# Patient Record
Sex: Female | Born: 1960 | Hispanic: Yes | Marital: Married | Smoking: Never smoker
Health system: Southern US, Community
[De-identification: ages and names within clinical notes are randomized; demographics above are authoritative.]

## PROBLEM LIST (undated history)

## (undated) DIAGNOSIS — E0590 Thyrotoxicosis, unspecified without thyrotoxic crisis or storm: Secondary | ICD-10-CM

## (undated) DIAGNOSIS — E785 Hyperlipidemia, unspecified: Secondary | ICD-10-CM

## (undated) DIAGNOSIS — D689 Coagulation defect, unspecified: Secondary | ICD-10-CM

## (undated) DIAGNOSIS — I2089 Other forms of angina pectoris: Secondary | ICD-10-CM

## (undated) DIAGNOSIS — J449 Chronic obstructive pulmonary disease, unspecified: Secondary | ICD-10-CM

## (undated) DIAGNOSIS — K254 Chronic or unspecified gastric ulcer with hemorrhage: Secondary | ICD-10-CM

## (undated) DIAGNOSIS — I208 Other forms of angina pectoris: Secondary | ICD-10-CM

## (undated) DIAGNOSIS — I4891 Unspecified atrial fibrillation: Secondary | ICD-10-CM

## (undated) DIAGNOSIS — D696 Thrombocytopenia, unspecified: Secondary | ICD-10-CM

## (undated) DIAGNOSIS — J45909 Unspecified asthma, uncomplicated: Secondary | ICD-10-CM

## (undated) DIAGNOSIS — D649 Anemia, unspecified: Secondary | ICD-10-CM

## (undated) DIAGNOSIS — K219 Gastro-esophageal reflux disease without esophagitis: Secondary | ICD-10-CM

## (undated) DIAGNOSIS — T883XXA Malignant hyperthermia due to anesthesia, initial encounter: Secondary | ICD-10-CM

## (undated) DIAGNOSIS — I509 Heart failure, unspecified: Secondary | ICD-10-CM

## (undated) DIAGNOSIS — H539 Unspecified visual disturbance: Secondary | ICD-10-CM

## (undated) DIAGNOSIS — I519 Heart disease, unspecified: Secondary | ICD-10-CM

## (undated) DIAGNOSIS — I219 Acute myocardial infarction, unspecified: Secondary | ICD-10-CM

## (undated) HISTORY — DX: Unspecified asthma, uncomplicated: J45.909

## (undated) HISTORY — DX: Unspecified visual disturbance: H53.9

## (undated) HISTORY — DX: Chronic or unspecified gastric ulcer with hemorrhage: K25.4

## (undated) HISTORY — DX: Gastro-esophageal reflux disease without esophagitis: K21.9

## (undated) HISTORY — DX: Other forms of angina pectoris: I20.8

## (undated) HISTORY — DX: Acute myocardial infarction, unspecified: I21.9

## (undated) HISTORY — DX: Heart disease, unspecified: I51.9

## (undated) HISTORY — DX: Thrombocytopenia, unspecified: D69.6

## (undated) HISTORY — DX: Coagulation defect, unspecified: D68.9

## (undated) HISTORY — DX: Chronic obstructive pulmonary disease, unspecified: J44.9

## (undated) HISTORY — DX: Heart failure, unspecified: I50.9

## (undated) HISTORY — DX: Malignant hyperthermia due to anesthesia, initial encounter: T88.3XXA

## (undated) HISTORY — DX: Anemia, unspecified: D64.9

## (undated) HISTORY — DX: Thyrotoxicosis, unspecified without thyrotoxic crisis or storm: E05.90

## (undated) HISTORY — DX: Unspecified atrial fibrillation: I48.91

## (undated) HISTORY — PX: OTHER SURGICAL HISTORY: SHX169

## (undated) HISTORY — DX: Hyperlipidemia, unspecified: E78.5

## (undated) HISTORY — DX: Other forms of angina pectoris: I20.89

---

## 1998-02-10 DIAGNOSIS — I219 Acute myocardial infarction, unspecified: Secondary | ICD-10-CM

## 1998-02-10 HISTORY — DX: Acute myocardial infarction, unspecified: I21.9

## 1998-02-10 HISTORY — PX: CORONARY ANGIOPLASTY WITH STENT PLACEMENT: SHX49

## 2013-03-23 ENCOUNTER — Ambulatory Visit: Payer: Self-pay | Admitting: Internal Medicine

## 2014-04-07 ENCOUNTER — Encounter: Payer: Self-pay | Admitting: General Surgery

## 2014-04-18 ENCOUNTER — Ambulatory Visit (INDEPENDENT_AMBULATORY_CARE_PROVIDER_SITE_OTHER): Payer: Medicaid Other | Admitting: General Surgery

## 2014-04-18 ENCOUNTER — Ambulatory Visit: Payer: Self-pay | Admitting: Internal Medicine

## 2014-04-18 ENCOUNTER — Encounter: Payer: Self-pay | Admitting: General Surgery

## 2014-04-18 VITALS — BP 140/70 | HR 84 | Resp 14 | Ht 65.5 in | Wt 174.0 lb

## 2014-04-18 DIAGNOSIS — Z1211 Encounter for screening for malignant neoplasm of colon: Secondary | ICD-10-CM | POA: Diagnosis not present

## 2014-04-18 MED ORDER — POLYETHYLENE GLYCOL 3350 17 GM/SCOOP PO POWD: 1.0000 | Freq: Once | ORAL | Status: AC

## 2014-04-18 NOTE — Progress Notes (Signed)
Patient ID: Desiree NorrisRosialie Ellis, female   DOB: 06/25/1960, 54 y.o.   MRN: [id number]  Chief Complaint  Patient presents with  . Other    screening colonoscopy    HPI Desiree NorrisRosialie Ellis is a 54 y.o. female here today for a evalution of a screening colonoscopy. No Gi problems at this time. No prior colonoscopies. No known family history of colon problems.   HPI  Past Medical History  Diagnosis Date  . Heart attack   . Hyperlipidemia   . GERD (gastroesophageal reflux disease)   . Asthma   . Bleeding stomach ulcer     Past Surgical History  Procedure Laterality Date  . Extraction of teeth    . Coronary angioplasty with stent placement  2000    Family History  Problem Relation Age of Onset  . Cancer Maternal Grandmother     unsure if uterian or cerivical    Social History History  Substance Use Topics  . Smoking status: Never Smoker   . Smokeless tobacco: Never Used  . Alcohol Use: Yes    Allergies  Allergen Reactions  . Aspirin Swelling  . Latex Rash  . Strawberry Rash    Current Outpatient Prescriptions  Medication Sig Dispense Refill  . digoxin (LANOXIN) 0.125 MG tablet Take 0.125 mg by mouth daily.    . furosemide (LASIX) 40 MG tablet Take 40 mg by mouth daily.    Marland Kitchen. ibuprofen (ADVIL,MOTRIN) 200 MG tablet Take 200 mg by mouth every 6 (six) hours as needed.    . Ipratropium-Albuterol (COMBIVENT) 20-100 MCG/ACT AERS respimat Inhale 1 puff into the lungs every 6 (six) hours.    . metoprolol succinate (TOPROL-XL) 50 MG 24 hr tablet Take 50 mg by mouth daily. Take with or immediately following a meal.    . nitroGLYCERIN (NITROSTAT) 0.4 MG SL tablet Place 0.4 mg under the tongue every 5 (five) minutes as needed for chest pain.    . potassium chloride SA (K-DUR,KLOR-CON) 20 MEQ tablet Take 20 mEq by mouth daily.    . ranitidine (ZANTAC) 150 MG capsule Take 150 mg by mouth 2 (two) times daily.    . vitamin C (ASCORBIC ACID) 500 MG tablet Take 500 mg by mouth daily.    .  polyethylene glycol powder (GLYCOLAX/MIRALAX) powder Take 255 g by mouth once. 255 g 0   No current facility-administered medications for this visit.    Review of Systems Review of Systems  Constitutional: Negative.   Respiratory: Negative.   Cardiovascular: Negative.   Gastrointestinal: Negative.     Blood pressure 140/70, pulse 84, resp. rate 14, height 5' 5.5" (1.664 m), weight 174 lb (78.926 kg).  Physical Exam Physical Exam  Constitutional: She is oriented to person, place, and time. She appears well-developed and well-nourished.  Cardiovascular: Normal rate, regular rhythm and normal heart sounds.   No murmur heard. Pulmonary/Chest: Effort normal and breath sounds normal.  Neurological: She is alert and oriented to person, place, and time.  Skin: Skin is warm and dry.    Data Reviewed Laboratory studies dated 03/09/2013 were reviewed. Creatinine 0.8, estimated GFR 85. Normal electrolytes. Her function studies. Serum digoxin 0.9.  Assessment    Candidate for screening colonoscopy.    Plan    The pros and cons of screening colonoscopy were reviewed in careful detail. The risks associated with the procedure including those of bleeding and perforation were discussed.   The patient denies any recent use of nitroglycerin for chest pain.  Patient is scheduled for  a Colonoscopy at [facility] on 05/23/14. She is aware to pre register with the hospital at least 2 days prior. She will only take her Digoxin and Toprol at 6 am with a small sip of water the morning of. Miralax prescription has been sent into her pharmacy. Patient is aware of date and all instructions.         Earline Mayotte 04/20/2014, 6:26 PM

## 2014-04-18 NOTE — Patient Instructions (Addendum)
Colonoscopy A colonoscopy is an exam to look at the entire large intestine (colon). This exam can help find problems such as tumors, polyps, inflammation, and areas of bleeding. The exam takes about 1 hour.  LET [facility] PROVID[facility] KNOW ABOUT:   Any allergies you have.  All medicines you are taking, including vitamins, herbs, eye drops, creams, and over-the-counter medicines.  Previous problems you or members of your family have had with the use of anesthetics.  Any blood disorders you have.  Previous surgeries you have had.  Medical conditions you have. RISKS AND COMPLICATIONS  Generally, this is a safe procedure. However, as with any procedure, complications can occur. Possible complications include:  Bleeding.  Tearing or rupture of the colon wall.  Reaction to medicines given during the exam.  Infection (rare). BEFORE THE PROCEDURE   Ask your health care provider about changing or stopping your regular medicines.  You may be prescribed an oral bowel prep. This involves drinking a large amount of medicated liquid, starting the day before your procedure. The liquid will cause you to have multiple loose stools until your stool is almost clear or light green. This cleans out your colon in preparation for the procedure.  Do not eat or drink anything else once you have started the bowel prep, unless your health care provider tells you it is safe to do so.  Arrange for someone to drive you home after the procedure. PROCEDURE   You will be given medicine to help you relax (sedative).  You will lie on your side with your knees bent.  A long, flexible tube with a light and camera on the end (colonoscope) will be inserted through the rectum and into the colon. The camera sends video back to a computer screen as it moves through the colon. The colonoscope also releases carbon dioxide gas to inflate the colon. This helps your health care provider see the area better.  During  the exam, your health care provider may take a small tissue sample (biopsy) to be examined under a microscope if any abnormalities are found.  The exam is finished when the entire colon has been viewed. AFTER THE PROCEDURE   Do not drive for 24 hours after the exam.  You may have a small amount of blood in your stool.  You may pass moderate amounts of gas and have mild abdominal cramping or bloating. This is caused by the gas used to inflate your colon during the exam.  Ask when your test results will be ready and how you will get your results. Make sure you get your test results. Document Released: 01/25/2000 Document Revised: 11/17/2012 Document Reviewed: 10/04/2012 [facility] Patient Information 2015 [employer] [place]. This information is not intended to replace advice given to you by your health care provider. Make sure you discuss any questions you have with your health care provider.  [facility] She is aware to pre register with the hospital at least 2 days prior. She will only take her Digoxin and Toprol at 6 am with a small sip of water the morning of. Miralax prescription has been sent into her pharmacy. Patient is aware of date and all instructions.

## 2014-04-20 ENCOUNTER — Other Ambulatory Visit: Payer: Self-pay | Admitting: General Surgery

## 2014-04-20 DIAGNOSIS — Z1211 Encounter for screening for malignant neoplasm of colon: Secondary | ICD-10-CM

## 2014-05-14 ENCOUNTER — Other Ambulatory Visit: Payer: Self-pay | Admitting: General Surgery

## 2014-05-14 DIAGNOSIS — Z1211 Encounter for screening for malignant neoplasm of colon: Secondary | ICD-10-CM

## 2014-05-18 ENCOUNTER — Telehealth: Payer: Self-pay | Admitting: *Deleted

## 2014-05-18 NOTE — Telephone Encounter (Signed)
Message for patient to call the office.  [facility] is [facility]. We need to confirm no medication changes and no change in medical history since last office visit. Also, confirm that patient has Miralax prescription and has pre-registered.

## 2014-05-19 NOTE — Telephone Encounter (Signed)
Patient called back and states that none of her prescription medications have changed. She is aware of all instructions.

## 2014-05-23 ENCOUNTER — Ambulatory Visit
Admit: 2014-05-23 | Disposition: A | Discharge status: Home / Self Care | Payer: Self-pay | Attending: General Surgery | Admitting: General Surgery

## 2014-05-23 ENCOUNTER — Encounter (INDEPENDENT_AMBULATORY_CARE_PROVIDER_SITE_OTHER): Payer: Medicaid Other | Admitting: General Surgery

## 2014-05-23 DIAGNOSIS — Z1211 Encounter for screening for malignant neoplasm of colon: Secondary | ICD-10-CM | POA: Diagnosis not present

## 2014-05-24 ENCOUNTER — Encounter: Payer: Self-pay | Admitting: General Surgery

## 2016-03-26 ENCOUNTER — Ambulatory Visit
Admission: RE | Admit: 2016-03-26 | Discharge: 2016-03-26 | Disposition: A | Discharge status: Home / Self Care | Payer: Medicaid Other | Source: Ambulatory Visit | Attending: Internal Medicine | Admitting: Internal Medicine

## 2016-03-26 ENCOUNTER — Other Ambulatory Visit: Payer: Self-pay | Admitting: Internal Medicine

## 2016-03-26 DIAGNOSIS — R059 Cough, unspecified: Secondary | ICD-10-CM

## 2016-03-26 DIAGNOSIS — I429 Cardiomyopathy, unspecified: Secondary | ICD-10-CM | POA: Diagnosis present

## 2016-03-26 DIAGNOSIS — R05 Cough: Secondary | ICD-10-CM

## 2016-03-26 DIAGNOSIS — I517 Cardiomegaly: Secondary | ICD-10-CM | POA: Insufficient documentation

## 2018-02-03 IMAGING — CR DG CHEST 2V
2 series · 2 of 2 positions shown · non-contrast
Comparison: None.

CLINICAL DATA: 55 y/o  F; cough and chest tightness.

EXAM:
CHEST  2 VIEW

[chest pa]
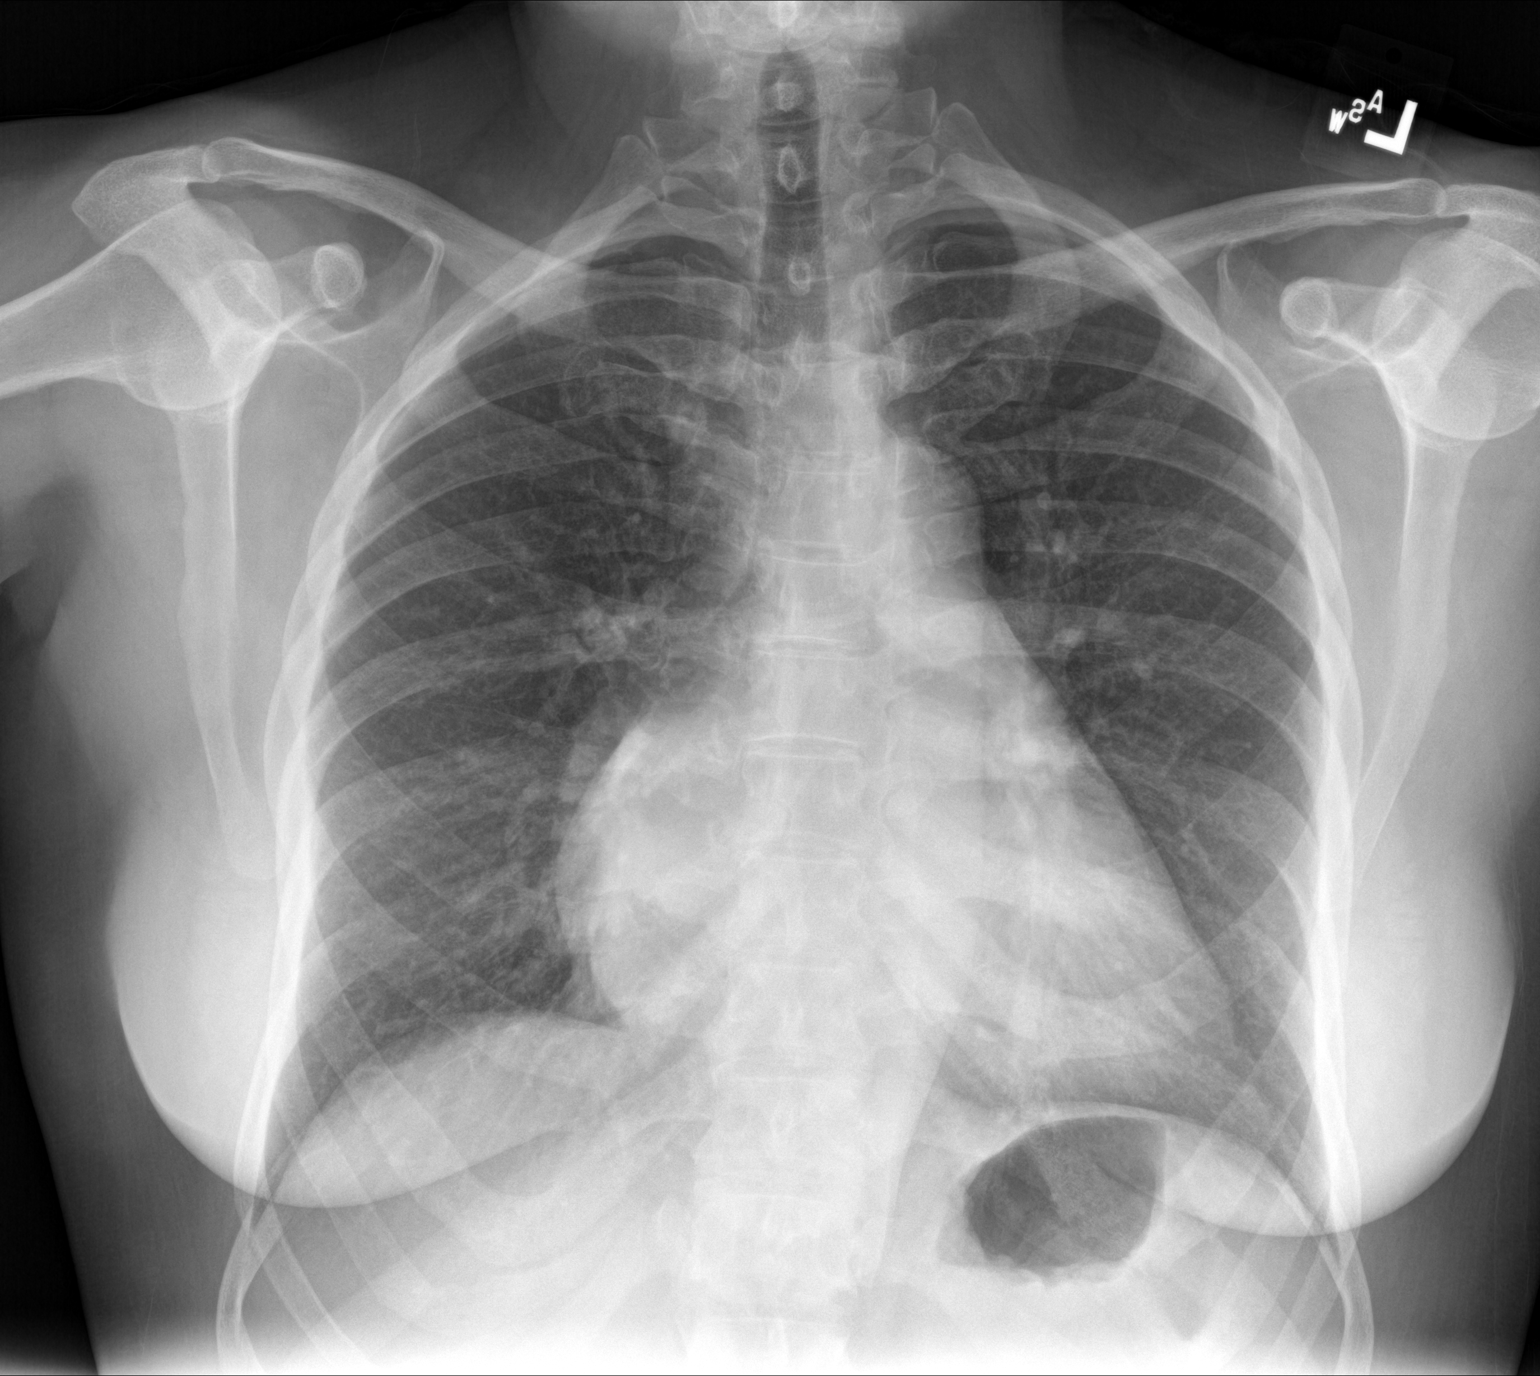

[chest lat]
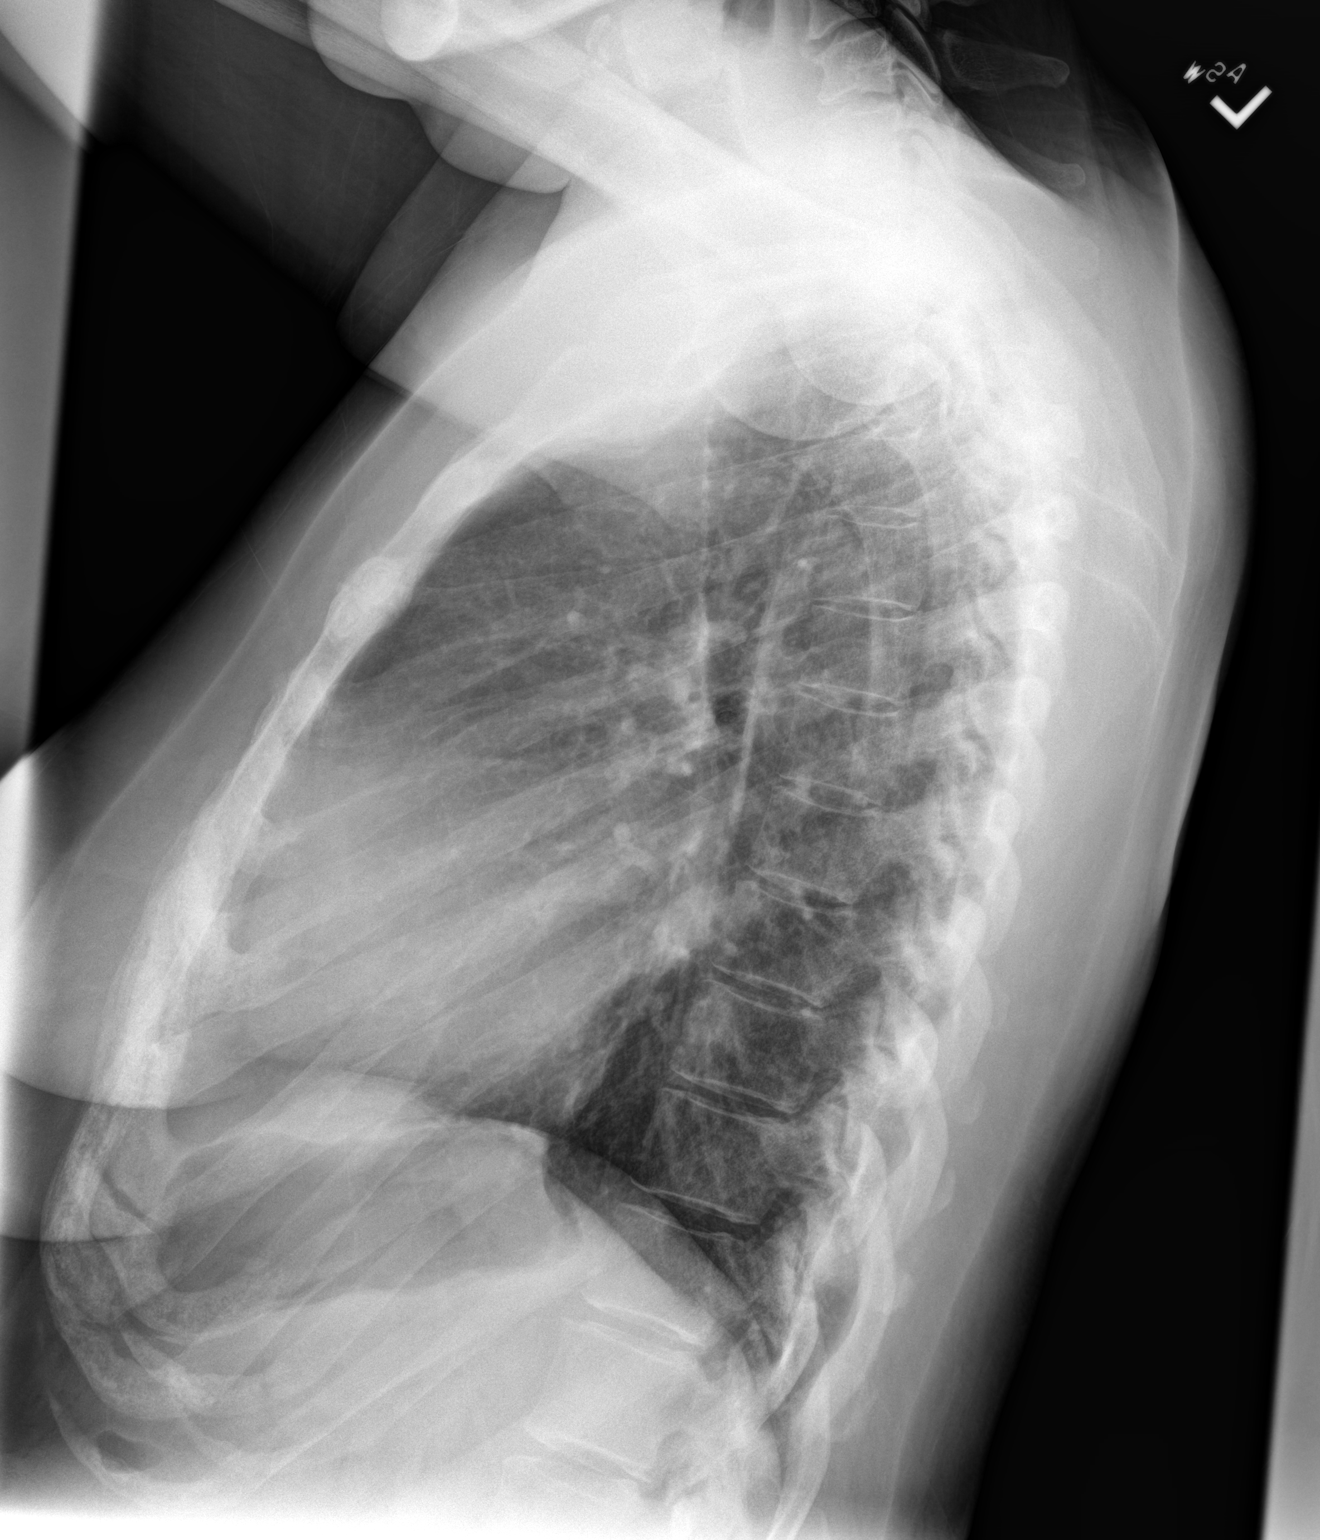

[2 of 2 positions shown; findings below may reference images not displayed]

FINDINGS: Moderate cardiomegaly. Clear lungs. No pleural effusion. No
pneumothorax. No acute osseous abnormality identified.
IMPRESSION: Moderate cardiomegaly.  No acute pulmonary process.

By: Jean Paulin Rabiatou M.D.

## 2019-08-09 HISTORY — PX: CATARACT EXTRACTION: SUR2

## 2019-08-16 HISTORY — PX: CATARACT EXTRACTION EXTRACAPSULAR: SHX1305

## 2020-06-21 DIAGNOSIS — I87329 Chronic venous hypertension (idiopathic) with inflammation of unspecified lower extremity: Secondary | ICD-10-CM

## 2020-06-21 HISTORY — DX: Chronic venous hypertension (idiopathic) with inflammation of unspecified lower extremity: I87.329

## 2021-04-17 ENCOUNTER — Encounter: Payer: Self-pay | Admitting: *Deleted

## 2021-04-23 ENCOUNTER — Inpatient Hospital Stay: Payer: Self-pay

## 2021-04-23 ENCOUNTER — Telehealth: Payer: Self-pay | Admitting: *Deleted

## 2021-04-23 ENCOUNTER — Inpatient Hospital Stay: Payer: Self-pay | Admitting: Internal Medicine

## 2021-04-23 NOTE — Telephone Encounter (Signed)
Patient needs to reschedule today's appointment she stated there was confusion about where her appointment was. ?

## 2021-05-03 ENCOUNTER — Encounter: Payer: Self-pay | Admitting: Internal Medicine

## 2021-05-03 ENCOUNTER — Other Ambulatory Visit: Payer: Self-pay
